# Patient Record
Sex: Male | Born: 1975 | Race: White | Hispanic: No | Marital: Single | Smoking: Current every day smoker
Health system: Southern US, Community
[De-identification: ages and names within clinical notes are randomized; demographics above are authoritative.]

## PROBLEM LIST (undated history)

## (undated) HISTORY — PX: APPENDECTOMY: SHX54

---

## 2004-05-13 ENCOUNTER — Emergency Department: Payer: Self-pay | Admitting: Emergency Medicine

## 2008-04-11 ENCOUNTER — Emergency Department (HOSPITAL_COMMUNITY): Admission: EM | Admit: 2008-04-11 | Discharge: 2008-04-11 | Payer: Self-pay | Admitting: Emergency Medicine

## 2011-04-07 ENCOUNTER — Emergency Department (HOSPITAL_COMMUNITY)
Admission: EM | Admit: 2011-04-07 | Discharge: 2011-04-07 | Disposition: A | Discharge status: Home / Self Care | Payer: Self-pay | Attending: Emergency Medicine | Admitting: Emergency Medicine

## 2011-04-07 ENCOUNTER — Encounter: Payer: Self-pay | Admitting: *Deleted

## 2011-04-07 DIAGNOSIS — F17200 Nicotine dependence, unspecified, uncomplicated: Secondary | ICD-10-CM | POA: Insufficient documentation

## 2011-04-07 DIAGNOSIS — Z9889 Other specified postprocedural states: Secondary | ICD-10-CM | POA: Insufficient documentation

## 2011-04-07 DIAGNOSIS — R21 Rash and other nonspecific skin eruption: Secondary | ICD-10-CM

## 2011-04-07 MED ORDER — PREDNISONE 20 MG PO TABS
40.0000 mg | ORAL_TABLET | Freq: Once | ORAL | Status: AC
  Administered 2011-04-07: 40 mg via ORAL
  Filled 2011-04-07: qty 2

## 2011-04-07 MED ORDER — PREDNISONE 20 MG PO TABS: 20.0000 mg | ORAL_TABLET | Freq: Every day | ORAL | Status: AC

## 2011-04-07 NOTE — ED Provider Notes (Signed)
History     CSN: [id number] Arrival date & time: 04/07/2011  4:03 AM   First MD Initiated Contact with Patient 04/07/11 0403      Chief Complaint  Patient presents with  . Rash    (Consider location/radiation/quality/duration/timing/severity/associated sxs/prior treatment) HPI Comments: Rash: Patient complains of rash involving the bilateral hand, torso and trunk. Rash started 3 days ago. Appearance of rash at onset: Color of lesion(s): pink, Texture of lesion(s): Flat and raised, Size of lesion(s): Several millimeters to several centimeters in size, Other appearance: Mild to moderate scaling. Rash has changed over time Initial distribution: bilateral arm.  Discomfort associated with rash: Is pruritic and now has a slightly burning feeling.  Associated symptoms: none. Denies: abdominal pain, arthralgia, congestion, cough, decrease in appetite, fever, headache, myalgia, nausea, sore throat and vomiting. Patient has not had previous evaluation of rash. Patient has not had previous treatment.  . Patient has not had contacts with similar rash. Patient has not identified precipitant. Patient has not had new exposures (soaps, lotions, laundry detergents, foods, medications, plants, insects or animals.)  He states that the rash started on his chest and upper back, has spread down his trunk and slightly onto his proximal thighs, minimal lesions on his arms and hands. He denies any vesicular lesions, pustular lesions or petechiae or purpura. He denies penile discharge, fever, stiff neck. Initially the rash was itchy but this has transitioned into more of a stinging feeling.  He has not tried any medications, denies oral lesions and has started no new medications. He takes no prescription medications and no over-the-counter medications   Patient is a 35 y.o. male presenting with rash. The history is provided by the patient.  Rash     History reviewed. No pertinent past medical history.  Past Surgical  History  Procedure Date  . Appendectomy     History reviewed. No pertinent family history.  History  Substance Use Topics  . Smoking status: Current Everyday Smoker -- 0.5 packs/day    Types: Cigarettes  . Smokeless tobacco: Not on file  . Alcohol Use: Yes      Review of Systems  Skin: Positive for rash.  All other systems reviewed and are negative.    Allergies  Review of patient's allergies indicates no known allergies.  Home Medications   Current Outpatient Rx  Name Route Sig Dispense Refill  . PREDNISONE 20 MG PO TABS Oral Take 1 tablet (20 mg total) by mouth daily. 10 tablet 0    BP 150/84  Pulse 77  Temp(Src) 97.7 F (36.5 C) (Oral)  Resp 16  Ht 6\' 6"  (1.981 m)  Wt 292 lb (132.45 kg)  BMI 33.74 kg/m2  SpO2 99%  Physical Exam  Nursing note and vitals reviewed. Constitutional: He appears well-developed and well-nourished. No distress.  HENT:  Head: Normocephalic and atraumatic.  Mouth/Throat: Oropharynx is clear and moist. No oropharyngeal exudate.       No oral lesions  Eyes: Conjunctivae and EOM are normal. Pupils are equal, round, and reactive to light. Right eye exhibits no discharge. Left eye exhibits no discharge. No scleral icterus.  Neck: Normal range of motion. Neck supple. No JVD present. No thyromegaly present.  Cardiovascular: Normal rate, regular rhythm, normal heart sounds and intact distal pulses.  Exam reveals no gallop and no friction rub.   No murmur heard. Pulmonary/Chest: Effort normal and breath sounds normal. No respiratory distress. He has no wheezes. He has no rales.  Abdominal: Soft. Bowel sounds are  normal. He exhibits no distension and no mass. There is no tenderness.  Musculoskeletal: Normal range of motion. He exhibits no edema and no tenderness.  Lymphadenopathy:    He has no cervical adenopathy.  Neurological: He is alert. Coordination normal.  Skin: Skin is warm and dry. Rash noted.       Scattered patches of pink  scaling skin covering the upper trauma, upper chest and upper back, proximal arms and minimal lesions on the hands. Scattered lesions across the thighs. No pustules, vesicles, petechiae or purpura  Psychiatric: He has a normal mood and affect. His behavior is normal.    ED Course  Procedures (including critical care time)  Labs Reviewed - No data to display No results found.   1. Rash       MDM  Consider pityriasis rosea versus a psoriatic outbreak possible sources. Does not appear to be an atopic dermatitis, prednisone therapy initiated though patient informed that may not be of any benefit to him. Dermatology followup contact information given and encouraged.        Vida Roller, MD 04/07/11 [phone]

## 2011-04-07 NOTE — ED Notes (Signed)
Pt given discharge instructions, paperwork & prescription(s), pt verbalized understanding.   

## 2011-04-07 NOTE — ED Notes (Signed)
Pt reports a rash on rt shoulder upper arm, left hand, that started 3 days ago & feels like it is spreading.

## 2011-04-07 NOTE — ED Notes (Signed)
Rash noted 3 days ago, pt states increase in rash & pain

## 2014-05-31 ENCOUNTER — Encounter (HOSPITAL_COMMUNITY): Payer: Self-pay | Admitting: *Deleted

## 2014-05-31 ENCOUNTER — Emergency Department (HOSPITAL_COMMUNITY)
Admission: EM | Admit: 2014-05-31 | Discharge: 2014-05-31 | Disposition: A | Discharge status: Home / Self Care | Payer: Self-pay | Attending: Emergency Medicine | Admitting: Emergency Medicine

## 2014-05-31 DIAGNOSIS — K029 Dental caries, unspecified: Secondary | ICD-10-CM | POA: Insufficient documentation

## 2014-05-31 DIAGNOSIS — K047 Periapical abscess without sinus: Secondary | ICD-10-CM | POA: Insufficient documentation

## 2014-05-31 DIAGNOSIS — Z720 Tobacco use: Secondary | ICD-10-CM | POA: Insufficient documentation

## 2014-05-31 DIAGNOSIS — Z792 Long term (current) use of antibiotics: Secondary | ICD-10-CM | POA: Insufficient documentation

## 2014-05-31 MED ORDER — AMOXICILLIN 250 MG PO CAPS
500.0000 mg | ORAL_CAPSULE | Freq: Once | ORAL | Status: AC
  Administered 2014-05-31: 500 mg via ORAL
  Filled 2014-05-31: qty 2

## 2014-05-31 MED ORDER — HYDROCODONE-ACETAMINOPHEN 5-325 MG PO TABS: 1.0000 | ORAL_TABLET | ORAL | Status: DC | PRN

## 2014-05-31 MED ORDER — HYDROCODONE-ACETAMINOPHEN 5-325 MG PO TABS
1.0000 | ORAL_TABLET | Freq: Once | ORAL | Status: AC
Start: 2014-05-31 — End: 2014-05-31
  Administered 2014-05-31: 1 via ORAL
  Filled 2014-05-31: qty 1

## 2014-05-31 MED ORDER — AMOXICILLIN 500 MG PO CAPS: 500.0000 mg | ORAL_CAPSULE | Freq: Three times a day (TID) | ORAL | Status: AC

## 2014-05-31 NOTE — ED Provider Notes (Signed)
CSN: [id number]     Arrival date & time 05/31/14  1455 History   First MD Initiated Contact with Patient 05/31/14 1611     Chief Complaint  Patient presents with  . Dental Pain     (Consider location/radiation/quality/duration/timing/severity/associated sxs/prior Treatment) Patient is a 39 y.o. male presenting with tooth pain. The history is provided by the patient.  Dental Pain Location:  Lower Lower teeth location:  18/LL 2nd molar Quality:  Sharp and throbbing Severity:  Severe Onset quality:  Gradual Timing:  Constant Progression:  Worsening Chronicity:  New Context: abscess, dental caries and dental fracture   Relieved by:  Nothing Worsened by:  Cold food/drink (has applied dental putty and obtained cold sensation relief) Ineffective treatments:  NSAIDs Associated symptoms: gum swelling   Associated symptoms: no difficulty swallowing, no drooling, no facial pain, no facial swelling, no fever, no neck pain, no neck swelling, no oral lesions and no trismus   Risk factors: no diabetes and no periodontal disease     History reviewed. No pertinent past medical history. Past Surgical History  Procedure Laterality Date  . Appendectomy     History reviewed. No pertinent family history. History  Substance Use Topics  . Smoking status: Current Every Day Smoker -- 0.50 packs/day    Types: Cigarettes  . Smokeless tobacco: Not on file  . Alcohol Use: Yes    Review of Systems  Constitutional: Negative for fever.  HENT: Positive for dental problem. Negative for drooling, facial swelling, mouth sores, rhinorrhea and sore throat.   Musculoskeletal: Negative for neck pain.      Allergies  Review of patient's allergies indicates no known allergies.  Home Medications   Prior to Admission medications   Medication Sig Start Date End Date Taking? Authorizing Provider  ibuprofen (ADVIL,MOTRIN) 200 MG tablet Take 800 mg by mouth every 6 (six) hours as needed for mild pain or  moderate pain.   Yes Historical Provider, MD  amoxicillin (AMOXIL) 500 MG capsule Take 1 capsule (500 mg total) by mouth 3 (three) times daily. 05/31/14 06/10/14  Burgess Amor, PA-C  HYDROcodone-acetaminophen (NORCO/VICODIN) 5-325 MG per tablet Take 1 tablet by mouth every 4 (four) hours as needed. 05/31/14   Burgess Amor, PA-C   BP 142/98 mmHg  Pulse 82  Temp(Src) 99.2 F (37.3 C) (Oral)  Resp 18  Ht  (2.007 m)  Wt 290 lb (131.543 kg)  BMI 32.66 kg/m2  SpO2 98% Physical Exam  Constitutional: He is oriented to person, place, and time. He appears well-developed and well-nourished. No distress.  HENT:  Head: Normocephalic and atraumatic.  Right Ear: Tympanic membrane and external ear normal.  Left Ear: Tympanic membrane and external ear normal.  Mouth/Throat: Oropharynx is clear and moist and mucous membranes are normal. No oral lesions. No trismus in the jaw. Abnormal dentition. Dental abscesses present.    Eyes: Conjunctivae are normal.  Neck: Normal range of motion. Neck supple.  Cardiovascular: Normal rate and normal heart sounds.   Pulmonary/Chest: Effort normal.  Abdominal: He exhibits no distension.  Musculoskeletal: Normal range of motion.  Lymphadenopathy:    He has no cervical adenopathy.  Neurological: He is alert and oriented to person, place, and time.  Skin: Skin is warm and dry. No erythema.  Psychiatric: He has a normal mood and affect.    ED Course  Procedures (including critical care time) Labs Review Labs Reviewed - No data to display  Imaging Review No results found.   EKG Interpretation None  MDM   Final diagnoses:  Dental abscess    Dental abscess without trismus, no drainable abscess.  No facial cellulitis.  Amoxil, hydrocodone.  Dental referrals given.    Burgess AmorJulie Denee Boeder, PA-C 05/31/14 1640  Benny LennertJoseph L Zammit, MD 06/22/14 [phone]

## 2014-05-31 NOTE — ED Notes (Signed)
Pt verbalized understanding of no driving and to use caution within 4 hours of taking pain meds due to meds cause drowsiness 

## 2014-05-31 NOTE — ED Notes (Signed)
Lt mandibular dental pain

## 2014-05-31 NOTE — Discharge Instructions (Signed)
Dental Abscess °A dental abscess is a collection of infected fluid (pus) from a bacterial infection in the inner part of the tooth (pulp). It usually occurs at the end of the tooth's root.  °CAUSES  °· Severe tooth decay. °· Trauma to the tooth that allows bacteria to enter into the pulp, such as a broken or chipped tooth. °SYMPTOMS  °· Severe pain in and around the infected tooth. °· Swelling and redness around the abscessed tooth or in the mouth or face. °· Tenderness. °· Pus drainage. °· Bad breath. °· Bitter taste in the mouth. °· Difficulty swallowing. °· Difficulty opening the mouth. °· Nausea. °· Vomiting. °· Chills. °· Swollen neck glands. °DIAGNOSIS  °· A medical and dental history will be taken. °· An examination will be performed by tapping on the abscessed tooth. °· X-rays may be taken of the tooth to identify the abscess. °TREATMENT °The goal of treatment is to eliminate the infection. You may be prescribed antibiotic medicine to stop the infection from spreading. A root canal may be performed to save the tooth. If the tooth cannot be saved, it may be pulled (extracted) and the abscess may be drained.  °HOME CARE INSTRUCTIONS °· Only take over-the-counter or prescription medicines for pain, fever, or discomfort as directed by your caregiver. °· Rinse your mouth (gargle) often with salt water (¼ tsp salt in 8 oz [250 ml] of warm water) to relieve pain or swelling. °· Do not drive after taking pain medicine (narcotics). °· Do not apply heat to the outside of your face. °· Return to your dentist for further treatment as directed. °SEEK MEDICAL CARE IF: °· Your pain is not helped by medicine. °· Your pain is getting worse instead of better. °SEEK IMMEDIATE MEDICAL CARE IF: °· You have a fever or persistent symptoms for more than 2-3 days. °· You have a fever and your symptoms suddenly get worse. °· You have chills or a very bad headache. °· You have problems breathing or swallowing. °· You have trouble  opening your mouth. °· You have swelling in the neck or around the eye. °Document Released: 04/23/2005 Document Revised: 01/16/2012 Document Reviewed: 08/01/2010 °[employer]® Patient Information ©2015 [employer]. This information is not intended to replace advice given to you by your health care provider. Make sure you discuss any questions you have with your health care provider. ° ° °Complete your entire course of antibiotics as prescribed.  You  may use the hydrocodone for pain relief but do not drive within 4 hours of taking as this will make you drowsy.  Avoid applying heat or ice to this abscess area which can worsen your symptoms.  You may use warm salt water swish and spit treatment or half peroxide and water swish and spit after meals to keep this area clean as discussed.  Call the dentist listed above for further management of your symptoms. ° ° °

## 2014-11-17 ENCOUNTER — Emergency Department (HOSPITAL_COMMUNITY)
Admission: EM | Admit: 2014-11-17 | Discharge: 2014-11-17 | Disposition: A | Discharge status: Home / Self Care | Payer: Self-pay | Attending: Emergency Medicine | Admitting: Emergency Medicine

## 2014-11-17 ENCOUNTER — Encounter (HOSPITAL_COMMUNITY): Payer: Self-pay | Admitting: Emergency Medicine

## 2014-11-17 ENCOUNTER — Emergency Department (HOSPITAL_COMMUNITY): Payer: Self-pay

## 2014-11-17 DIAGNOSIS — Z880 Allergy status to penicillin: Secondary | ICD-10-CM | POA: Insufficient documentation

## 2014-11-17 DIAGNOSIS — J0100 Acute maxillary sinusitis, unspecified: Secondary | ICD-10-CM | POA: Insufficient documentation

## 2014-11-17 DIAGNOSIS — Z720 Tobacco use: Secondary | ICD-10-CM | POA: Insufficient documentation

## 2014-11-17 DIAGNOSIS — G4700 Insomnia, unspecified: Secondary | ICD-10-CM | POA: Insufficient documentation

## 2014-11-17 MED ORDER — AZITHROMYCIN 250 MG PO TABS: 250.0000 mg | ORAL_TABLET | Freq: Every day | ORAL | Status: DC

## 2014-11-17 MED ORDER — PROMETHAZINE-CODEINE 6.25-10 MG/5ML PO SYRP: 5.0000 mL | ORAL_SOLUTION | ORAL | Status: DC | PRN

## 2014-11-17 MED ORDER — AZITHROMYCIN 250 MG PO TABS
500.0000 mg | ORAL_TABLET | Freq: Once | ORAL | Status: AC
  Administered 2014-11-17: 500 mg via ORAL
  Filled 2014-11-17: qty 2

## 2014-11-17 NOTE — Discharge Instructions (Signed)
Insomnia °Insomnia is frequent trouble falling and/or staying asleep. Insomnia can be a long term problem or a short term problem. Both are common. Insomnia can be a short term problem when the wakefulness is related to a certain stress or worry. Long term insomnia is often related to ongoing stress during waking hours and/or poor sleeping habits. Overtime, sleep deprivation itself can make the problem worse. Every little thing feels more severe because you are overtired and your ability to cope is decreased. °CAUSES  °· Stress, anxiety, and depression. °· Poor sleeping habits. °· Distractions such as TV in the bedroom. °· Naps close to bedtime. °· Engaging in emotionally charged conversations before bed. °· Technical reading before sleep. °· Alcohol and other sedatives. They may make the problem worse. They can hurt normal sleep patterns and normal dream activity. °· Stimulants such as caffeine for several hours prior to bedtime. °· Pain syndromes and shortness of breath can cause insomnia. °· Exercise late at night. °· Changing time zones may cause sleeping problems (jet lag). °It is sometimes helpful to have someone observe your sleeping patterns. They should look for periods of not breathing during the night (sleep apnea). They should also look to see how long those periods last. If you live alone or observers are uncertain, you can also be observed at a sleep clinic where your sleep patterns will be professionally monitored. Sleep apnea requires a checkup and treatment. Give your caregivers your medical history. Give your caregivers observations your family has made about your sleep.  °SYMPTOMS  °· Not feeling rested in the morning. °· Anxiety and restlessness at bedtime. °· Difficulty falling and staying asleep. °TREATMENT  °· Your caregiver may prescribe treatment for an underlying medical disorders. Your caregiver can give advice or help if you are using alcohol or other drugs for self-medication. Treatment  of underlying problems will usually eliminate insomnia problems. °· Medications can be prescribed for short time use. They are generally not recommended for lengthy use. °· Over-the-counter sleep medicines are not recommended for lengthy use. They can be habit forming. °· You can promote easier sleeping by making lifestyle changes such as: °· Using relaxation techniques that help with breathing and reduce muscle tension. °· Exercising earlier in the day. °· Changing your diet and the time of your last meal. No night time snacks. °· Establish a regular time to go to bed. °· Counseling can help with stressful problems and worry. °· Soothing music and white noise may be helpful if there are background noises you cannot remove. °· Stop tedious detailed work at least one hour before bedtime. °HOME CARE INSTRUCTIONS  °· Keep a diary. Inform your caregiver about your progress. This includes any medication side effects. See your caregiver regularly. Take note of: °· Times when you are asleep. °· Times when you are awake during the night. °· The quality of your sleep. °· How you feel the next day. °This information will help your caregiver care for you. °· Get out of bed if you are still awake after 15 minutes. Read or do some quiet activity. Keep the lights down. Wait until you feel sleepy and go back to bed. °· Keep regular sleeping and waking hours. Avoid naps. °· Exercise regularly. °· Avoid distractions at bedtime. Distractions include watching television or engaging in any intense or detailed activity like attempting to balance the household checkbook. °· Develop a bedtime ritual. Keep a familiar routine of bathing, brushing your teeth, climbing into bed at the same   time each night, listening to soothing music. Routines increase the success of falling to sleep faster.  Use relaxation techniques. This can be using breathing and muscle tension release routines. It can also include visualizing peaceful scenes. You can  also help control troubling or intruding thoughts by keeping your mind occupied with boring or repetitive thoughts like the old concept of counting sheep. You can make it more creative like imagining planting one beautiful flower after another in your backyard garden.  During your day, work to eliminate stress. When this is not possible use some of the previous suggestions to help reduce the anxiety that accompanies stressful situations. MAKE SURE YOU:   Understand these instructions.  Will watch your condition.  Will get help right away if you are not doing well or get worse. Document Released: 04/20/2000 Document Revised: 07/16/2011 Document Reviewed: 05/21/2007 [facility] Patient Information 2015 [place], [place]. This information is not intended to replace advice given to you by your health care provider. Make sure you discuss any questions you have with your health care provider.  Sinusitis Sinusitis is redness, soreness, and inflammation of the paranasal sinuses. Paranasal sinuses are air pockets within the bones of your face (beneath the eyes, the middle of the forehead, or above the eyes). In healthy paranasal sinuses, mucus is able to drain out, and air is able to circulate through them by way of your nose. However, when your paranasal sinuses are inflamed, mucus and air can become trapped. This can allow bacteria and other germs to grow and cause infection. Sinusitis can develop quickly and last only a short time (acute) or continue over a long period (chronic). Sinusitis that lasts for more than 12 weeks is considered chronic.  CAUSES  Causes of sinusitis include:  Allergies.  Structural abnormalities, such as displacement of the cartilage that separates your nostrils (deviated septum), which can decrease the air flow through your nose and sinuses and affect sinus drainage.  Functional abnormalities, such as when the small hairs (cilia) that line your sinuses and help remove mucus do  not work properly or are not present. SIGNS AND SYMPTOMS  Symptoms of acute and chronic sinusitis are the same. The primary symptoms are pain and pressure around the affected sinuses. Other symptoms include:  Upper toothache.  Earache.  Headache.  Bad breath.  Decreased sense of smell and taste.  A cough, which worsens when you are lying flat.  Fatigue.  Fever.  Thick drainage from your nose, which often is green and may contain pus (purulent).  Swelling and warmth over the affected sinuses. DIAGNOSIS  Your health care provider will perform a physical exam. During the exam, your health care provider may:  Look in your nose for signs of abnormal growths in your nostrils (nasal polyps).  Tap over the affected sinus to check for signs of infection.  View the inside of your sinuses (endoscopy) using an imaging device that has a light attached (endoscope). If your health care provider suspects that you have chronic sinusitis, one or more of the following tests may be recommended:  Allergy tests.  Nasal culture. A sample of mucus is taken from your nose, sent to a lab, and screened for bacteria.  Nasal cytology. A sample of mucus is taken from your nose and examined by your health care provider to determine if your sinusitis is related to an allergy. TREATMENT  Most cases of acute sinusitis are related to a viral infection and will resolve on their own within 10 days.  Sometimes medicines are prescribed to help relieve symptoms (pain medicine, decongestants, nasal steroid sprays, or saline sprays).  However, for sinusitis related to a bacterial infection, your health care provider will prescribe antibiotic medicines. These are medicines that will help kill the bacteria causing the infection.  Rarely, sinusitis is caused by a fungal infection. In theses cases, your health care provider will prescribe antifungal medicine. For some cases of chronic sinusitis, surgery is needed.  Generally, these are cases in which sinusitis recurs more than 3 times per year, despite other treatments. HOME CARE INSTRUCTIONS   Drink plenty of water. Water helps thin the mucus so your sinuses can drain more easily.  Use a humidifier.  Inhale steam 3 to 4 times a day (for example, sit in the bathroom with the shower running).  Apply a warm, moist washcloth to your face 3 to 4 times a day, or as directed by your health care provider.  Use saline nasal sprays to help moisten and clean your sinuses.  Take medicines only as directed by your health care provider.  If you were prescribed either an antibiotic or antifungal medicine, finish it all even if you start to feel better. SEEK IMMEDIATE MEDICAL CARE IF:  You have increasing pain or severe headaches.  You have nausea, vomiting, or drowsiness.  You have swelling around your face.  You have vision problems.  You have a stiff neck.  You have difficulty breathing. MAKE SURE YOU:   Understand these instructions.  Will watch your condition.  Will get help right away if you are not doing well or get worse. Document Released: 04/23/2005 Document Revised: 09/07/2013 Document Reviewed: 05/08/2011 [facility] Patient Information 2015 [place], [place]. This information is not intended to replace advice given to you by your health care provider. Make sure you discuss any questions you have with your health care provider.   Take your next dose of Zithromax tomorrow afternoon.  You may use the cough medication prescribed to help you with your symptoms, this will also make you drowsy.  Take a dose of this prior to bedtime to help sleep, however use caution during the daytime, do not drive within 4 hours of taking.  Which you're drinking plenty of fluids.  You may also try menthol cough drops or Vicks brand vapor inhaler sticks which can help with congestion.

## 2014-11-17 NOTE — ED Notes (Signed)
Pt reports nasal congestion,cough,body aches,chills x1 month. Pt denies any n/v/d.

## 2014-11-17 NOTE — ED Notes (Signed)
Julie Idol at bedside. 

## 2014-11-19 NOTE — ED Provider Notes (Signed)
CSN: [id number]     Arrival date & time 11/17/14  1142 History   First MD Initiated Contact with Patient 11/17/14 1411     Chief Complaint  Patient presents with  . Generalized Body Aches     (Consider location/radiation/quality/duration/timing/severity/associated sxs/prior Treatment) The history is provided by the patient.   Eddie Scott is a 39 y.o. male with no significant past medical history presenting with a one month history of nasal congestion with clear rhinorrhea, post nasal drip, non productive cough.  Over the past week he has developed generalized body aches with chills and suspected fever, facial pain and pressure suggestive of a sinus infection.  He has taken nyquil severe cold/flu formula and an otc sinus medicine without relief of symptoms.  He endorses difficulty sleeping at night the past 2 weeks possibly due to symptoms but finds himself just lying awake unable to sleep. He denies sob, chest pain, headache, dizziness.  He does feel pressure in his ears, no earache or drainage.     History reviewed. No pertinent past medical history. Past Surgical History  Procedure Laterality Date  . Appendectomy     History reviewed. No pertinent family history. History  Substance Use Topics  . Smoking status: Current Every Day Smoker -- 0.50 packs/day    Types: Cigarettes  . Smokeless tobacco: Not on file  . Alcohol Use: Yes     Comment: occasional    Review of Systems  Constitutional: Positive for fever and chills.  HENT: Positive for congestion, rhinorrhea and sinus pressure. Negative for ear discharge, ear pain, facial swelling, sore throat, trouble swallowing and voice change.   Eyes: Negative for discharge.  Respiratory: Positive for cough. Negative for shortness of breath, wheezing and stridor.   Cardiovascular: Negative for chest pain.  Gastrointestinal: Negative for abdominal pain.  Genitourinary: Negative.       Allergies  Penicillins  Home Medications    Prior to Admission medications   Medication Sig Start Date End Date Taking? Authorizing Provider  diphenhydramine-acetaminophen (TYLENOL PM) 25-500 MG TABS Take 2 tablets by mouth at bedtime as needed (sleep).   Yes Historical Provider, MD  OVER THE COUNTER MEDICATION Take 2 tablets by mouth daily as needed (sinus  **OTC sinus medication**).   Yes Historical Provider, MD  Phenyleph-Doxylamine-DM-APAP (NYQUIL SEVERE COLD/FLU) 5-6.25-10-325 MG/15ML LIQD Take 30 mLs by mouth at bedtime as needed (cold symptoms).   Yes Historical Provider, MD  azithromycin (ZITHROMAX) 250 MG tablet Take 1 tablet (250 mg total) by mouth daily. 11/17/14   Burgess Amor, PA-C  promethazine-codeine (PHENERGAN WITH CODEINE) 6.25-10 MG/5ML syrup Take 5 mLs by mouth every 4 (four) hours as needed for cough. 11/17/14   Burgess Amor, PA-C   BP 134/76 mmHg  Pulse 59  Temp(Src) 97.6 F (36.4 C) (Oral)  Resp 18  Ht  (2.007 m)  Wt 290 lb (131.543 kg)  BMI 32.66 kg/m2  SpO2 100% Physical Exam  Constitutional: He is oriented to person, place, and time. He appears well-developed and well-nourished.  HENT:  Head: Normocephalic and atraumatic.  Right Ear: Tympanic membrane and ear canal normal.  Left Ear: Tympanic membrane and ear canal normal.  Nose: Mucosal edema and rhinorrhea present. Right sinus exhibits maxillary sinus tenderness. Left sinus exhibits maxillary sinus tenderness.  Mouth/Throat: Uvula is midline, oropharynx is clear and moist and mucous membranes are normal. No oropharyngeal exudate, posterior oropharyngeal edema, posterior oropharyngeal erythema or tonsillar abscesses.  Eyes: Conjunctivae are normal.  Neck: Normal range of  motion. Neck supple.  Cardiovascular: Normal rate and normal heart sounds.   Pulmonary/Chest: Effort normal. No respiratory distress. He has no wheezes. He has no rhonchi. He has no rales.  Abdominal: Soft. There is no tenderness.  Musculoskeletal: Normal range of motion.   Neurological: He is alert and oriented to person, place, and time.  Skin: Skin is warm and dry. No rash noted.  Psychiatric: He has a normal mood and affect.    ED Course  Procedures (including critical care time) Labs Review Labs Reviewed - No data to display  Imaging Review Dg Chest 2 View  11/17/2014   CLINICAL DATA:  Chest congestion, sinus congestion for 1 month  EXAM: CHEST  2 VIEW  COMPARISON:  None.  FINDINGS: The heart size and mediastinal contours are within normal limits. Both lungs are clear. The visualized skeletal structures are unremarkable.  IMPRESSION: No active cardiopulmonary disease.   Electronically Signed   By: Natasha MeadLiviu  Pop M.D.   On: 11/17/2014 13:24     EKG Interpretation None      MDM   Final diagnoses:  Acute maxillary sinusitis, recurrence not specified  Insomnia   Patients labs and/or radiological studies were reviewed and considered during the medical decision making and disposition process.  Results were also discussed with patient. No pneumonia per xray.    Pt with acute sinusitis.  He also endorses difficulty sleeping, suspect possibly related to pseudoephedrine use.  He was placed on zithromax, phenergan with codeine prescribed for cough and to help sleep, advised to not take pseudoephdrine containing products in the evening.  May try menthol drops, vicks vapor stick, steam for congestion relief. Prn f/u if sx persist or worsen.  The patient appears reasonably screened and/or stabilized for discharge and I doubt any other medical condition or other [facility] requiring further screening, evaluation, or treatment in the ED at this time prior to discharge.     Burgess AmorJulie Angala Hilgers, PA-C 11/19/14 1231  Vanetta MuldersScott Zackowski, MD 11/20/14 [phone]

## 2016-08-31 ENCOUNTER — Encounter (HOSPITAL_COMMUNITY): Payer: Self-pay

## 2016-08-31 ENCOUNTER — Emergency Department (HOSPITAL_COMMUNITY)
Admission: EM | Admit: 2016-08-31 | Discharge: 2016-09-01 | Disposition: A | Discharge status: Home / Self Care | Payer: Self-pay | Attending: Emergency Medicine | Admitting: Emergency Medicine

## 2016-08-31 DIAGNOSIS — Z880 Allergy status to penicillin: Secondary | ICD-10-CM | POA: Insufficient documentation

## 2016-08-31 DIAGNOSIS — X58XXXA Exposure to other specified factors, initial encounter: Secondary | ICD-10-CM | POA: Insufficient documentation

## 2016-08-31 DIAGNOSIS — T18128A Food in esophagus causing other injury, initial encounter: Secondary | ICD-10-CM | POA: Insufficient documentation

## 2016-08-31 DIAGNOSIS — K449 Diaphragmatic hernia without obstruction or gangrene: Secondary | ICD-10-CM | POA: Insufficient documentation

## 2016-08-31 DIAGNOSIS — F17210 Nicotine dependence, cigarettes, uncomplicated: Secondary | ICD-10-CM | POA: Insufficient documentation

## 2016-08-31 MED ORDER — ONDANSETRON HCL 4 MG/2ML IJ SOLN
4.0000 mg | Freq: Once | INTRAMUSCULAR | Status: AC
  Administered 2016-08-31: 4 mg via INTRAVENOUS
  Filled 2016-08-31: qty 2

## 2016-08-31 MED ORDER — GLUCAGON HCL RDNA (DIAGNOSTIC) 1 MG IJ SOLR
1.0000 mg | Freq: Once | INTRAMUSCULAR | Status: AC
  Administered 2016-08-31: 1 mg via INTRAVENOUS
  Filled 2016-08-31: qty 1

## 2016-08-31 MED ORDER — DIAZEPAM 5 MG/ML IJ SOLN
5.0000 mg | Freq: Once | INTRAMUSCULAR | Status: AC
  Administered 2016-08-31: 5 mg via INTRAVENOUS
  Filled 2016-08-31: qty 2

## 2016-08-31 MED ORDER — SODIUM CHLORIDE 0.9 % IV SOLN
Freq: Once | INTRAVENOUS | Status: AC
  Administered 2016-08-31: via INTRAVENOUS

## 2016-08-31 NOTE — ED Triage Notes (Signed)
Pt states he was eating steak an hour ago when he felt a piece get stuck and has not been able to swallow it down.  Pt reports history of same in the past but was able to get it down eventually.

## 2016-08-31 NOTE — ED Provider Notes (Signed)
[facility]-EM[facility]Y DEPT Provider Note   CSN: [id number] Arrival date & time: 08/31/16  2307  By signing my name below, I, Eddie Scott, attest that this documentation has been prepared under the direction and in the presence of [name], MD. Electronically Signed: Elder Scott, Scribe. 08/31/16. 11:27 PM.   History   Chief Complaint Chief Complaint  Patient presents with  . Food stuck in esophagus    HPI Eddie Scott is a 41 y.o. male without any chronic medical problems who presents to the ED with sensation or foreign body in throat. This patient states that approximately 1 hour ago he was eating boneless steak when he felt a large bolus become stuck in his throat. He has been unable to tolerate his own saliva since that time. He denies any difficulty respirating or speaking. He has had a similar problem happen in the past, but at the time was able to pass the bolus on his own without dilation. Denies any chest pain or cardiac history.   The history is provided by the patient. No language interpreter was used.    History reviewed. No pertinent past medical history.  There are no active problems to display for this patient.   Past Surgical History:  Procedure Laterality Date  . APPENDECTOMY         Home Medications    Prior to Admission medications   Medication Sig Start Date End Date Taking? Authorizing Provider  azithromycin (ZITHROMAX) 250 MG tablet Take 1 tablet (250 mg total) by mouth daily. 11/17/14   Burgess Amor, PA-C  diphenhydramine-acetaminophen (TYLENOL PM) 25-500 MG TABS Take 2 tablets by mouth at bedtime as needed (sleep).    Historical Provider, MD  OVER THE COUNTER MEDICATION Take 2 tablets by mouth daily as needed (sinus  **OTC sinus medication**).    Historical Provider, MD  Phenyleph-Doxylamine-DM-APAP (NYQUIL SEVERE COLD/FLU) 5-6.25-10-325 MG/15ML LIQD Take 30 mLs by mouth at bedtime as needed (cold symptoms).    Historical Provider, MD    promethazine-codeine (PHENERGAN WITH CODEINE) 6.25-10 MG/5ML syrup Take 5 mLs by mouth every 4 (four) hours as needed for cough. 11/17/14   Burgess Amor, PA-C    Family History No family history on file.  Social History Social History  Substance Use Topics  . Smoking status: Current Every Day Smoker    Packs/day: 0.50    Types: Cigarettes  . Smokeless tobacco: Never Used  . Alcohol use Yes     Comment: occasional     Allergies   Penicillins   Review of Systems Review of Systems  HENT: Negative for trouble swallowing.        No difficulty swallowing. Sensation of foreign body in throat.   Cardiovascular: Negative for chest pain.  All other systems reviewed and are negative.    Physical Exam Updated Vital Signs BP 118/83 (BP Location: Left Arm)   Pulse 71   Resp (!) 22   Ht  (2.007 m)   Wt 300 lb (136.1 kg)   SpO2 100%   BMI 33.80 kg/m   Physical Exam  Constitutional: He is oriented to person, place, and time. He appears well-developed and well-nourished.  Appears uncomfortable. Spitting in emesis bag.   HENT:  Head: Normocephalic and atraumatic.  Mouth/Throat: Oropharynx is clear and moist. No oropharyngeal exudate.  No foreign bodies visualized in the oropharynx.   Eyes: Conjunctivae and EOM are normal. Pupils are equal, round, and reactive to light.  Neck: Normal range of motion. Neck supple.  No meningismus.  Cardiovascular: Regular rhythm, normal heart sounds and intact distal pulses.  Tachycardia present.   No murmur heard. Pulmonary/Chest: Effort normal and breath sounds normal. No respiratory distress.  Abdominal: Soft. There is no tenderness. There is no rebound and no guarding.  Musculoskeletal: Normal range of motion. He exhibits no edema or tenderness.  Neurological: He is alert and oriented to person, place, and time. No cranial nerve deficit. He exhibits normal muscle tone. Coordination normal.   5/5 strength throughout. CN 2-12 intact.Equal  grip strength.   Skin: Skin is warm. He is diaphoretic.  Psychiatric: He has a normal mood and affect. His behavior is normal.  Nursing note and vitals reviewed.    ED Treatments / Results  Labs (all labs ordered are listed, but only abnormal results are displayed) Labs Reviewed  CBC WITH DIFFERENTIAL/PLATELET - Abnormal; Notable for the following:       Result Value   WBC 23.8 (*)    Hemoglobin 17.2 (*)    Neutro Abs 20.6 (*)    Monocytes Absolute 1.4 (*)    All other components within normal limits  BASIC METABOLIC PANEL - Abnormal; Notable for the following:    CO2 20 (*)    Glucose, Bld 101 (*)    All other components within normal limits  TROPONIN I    EKG  EKG Interpretation  Date/Time:  Friday August 31 2016 23:38:34 EDT Ventricular Rate:  86 PR Interval:    QRS Duration: 114 QT Interval:  392 QTC Calculation: 469 R Axis:   77 Text Interpretation:  Sinus rhythm Borderline intraventricular conduction delay No previous ECGs available Confirmed by Manus Gunning  MD, Abdulaziz Toman ([id number]) on 09/01/2016 12:01:34 AM       Radiology No results found.  Procedures Procedures (including critical care time)  Medications Ordered in ED Medications  0.9 %  sodium chloride infusion (not administered)  ondansetron (ZOFRAN) injection 4 mg (4 mg Intravenous Given 08/31/16 2330)  diazepam (VALIUM) injection 5 mg (5 mg Intravenous Given 08/31/16 2330)  glucagon (human recombinant) (GLUCAGEN) injection 1 mg (1 mg Intravenous Given 08/31/16 2330)     Initial Impression / Assessment and Plan / ED Course  I have reviewed the triage vital signs and the nursing notes.  Pertinent labs & imaging results that were available during my care of the patient were reviewed by me and considered in my medical decision making (see chart for details).    Patient presents with likely food impaction. He is anxious, diaphoretic, tearful, spitting and emesis bag.  Patient given IV Valium and IV glucagon  with antiemetics.  Patient feels like the bolus has moved slightly but is still spitting in emesis bag. Unable tolerate by mouth. Discussed with Dr. Jena Gauss who will plan for endoscopy.  Patient taken to endoscopy with Dr. Jena Gauss.  Airway remains patent.   Final Clinical Impressions(s) / ED Diagnoses   Final diagnoses:  Food impaction of esophagus, initial encounter    New Prescriptions New Prescriptions   No medications on file  I personally performed the services described in this documentation, which was scribed in my presence. The recorded information has been reviewed and is accurate.    [name], MD 09/01/16 [phone]

## 2016-09-01 ENCOUNTER — Encounter (HOSPITAL_COMMUNITY): Payer: Self-pay | Admitting: Anesthesiology

## 2016-09-01 ENCOUNTER — Encounter (HOSPITAL_COMMUNITY)
Admission: EM | Disposition: A | Discharge status: Home / Self Care | Payer: Self-pay | Source: Home / Self Care | Attending: Emergency Medicine

## 2016-09-01 ENCOUNTER — Encounter (HOSPITAL_COMMUNITY): Payer: Self-pay

## 2016-09-01 DIAGNOSIS — R1310 Dysphagia, unspecified: Secondary | ICD-10-CM

## 2016-09-01 HISTORY — PX: ESOPHAGOGASTRODUODENOSCOPY: SHX1529

## 2016-09-01 HISTORY — PX: ESOPHAGOGASTRODUODENOSCOPY: SHX5428

## 2016-09-01 LAB — BASIC METABOLIC PANEL
ANION GAP: 14 (ref 5–15)
BUN: 17 mg/dL (ref 6–20)
CHLORIDE: 107 mmol/L (ref 101–111)
CO2: 20 mmol/L — AB (ref 22–32)
CREATININE: 1.18 mg/dL (ref 0.61–1.24)
Calcium: 9.9 mg/dL (ref 8.9–10.3)
GFR calc Af Amer: >60 mL/min (ref >60)
GFR calc non Af Amer: >60 mL/min (ref >60)
GLUCOSE: 101 mg/dL — AB (ref 65–99)
Potassium: 3.6 mmol/L (ref 3.5–5.1)
Sodium: 141 mmol/L (ref 135–145)

## 2016-09-01 LAB — CBC WITH DIFFERENTIAL/PLATELET
Basophils Absolute: 0.1 10*3/uL (ref 0.0–0.1)
Basophils Relative: 0 %
Eosinophils Absolute: 0.1 10*3/uL (ref 0.0–0.7)
Eosinophils Relative: 1 %
HEMATOCRIT: 50.8 % (ref 39.0–52.0)
HEMOGLOBIN: 17.2 g/dL — AB (ref 13.0–17.0)
LYMPHS ABS: 1.7 10*3/uL (ref 0.7–4.0)
LYMPHS PCT: 7 %
MCH: 30.2 pg (ref 26.0–34.0)
MCHC: 33.9 g/dL (ref 30.0–36.0)
MCV: 89.1 fL (ref 78.0–100.0)
MONO ABS: 1.4 10*3/uL — AB (ref 0.1–1.0)
MONOS PCT: 6 %
NEUTROS PCT: 86 %
Neutro Abs: 20.6 10*3/uL — ABNORMAL HIGH (ref 1.7–7.7)
Platelets: 261 10*3/uL (ref 150–400)
RBC: 5.7 MIL/uL (ref 4.22–5.81)
RDW: 13.5 % (ref 11.5–15.5)
WBC: 23.8 10*3/uL — ABNORMAL HIGH (ref 4.0–10.5)

## 2016-09-01 LAB — TROPONIN I: Troponin I: <0.03 ng/mL (ref <0.03)

## 2016-09-01 SURGERY — EGD (ESOPHAGOGASTRODUODENOSCOPY)
Anesthesia: Moderate Sedation

## 2016-09-01 MED ORDER — LIDOCAINE VISCOUS 2 % MT SOLN
OROMUCOSAL | Status: DC
Start: 2016-09-01 — End: 2016-09-01
  Filled 2016-09-01: qty 15

## 2016-09-01 MED ORDER — ONDANSETRON HCL 4 MG/2ML IJ SOLN
INTRAMUSCULAR | Status: AC
  Filled 2016-09-01: qty 2

## 2016-09-01 MED ORDER — BUTAMBEN-TETRACAINE-BENZOCAINE 2-2-14 % EX AERO
INHALATION_SPRAY | CUTANEOUS | Status: AC
  Filled 2016-09-01: qty 20

## 2016-09-01 MED ORDER — ONDANSETRON HCL 4 MG/2ML IJ SOLN
INTRAMUSCULAR | Status: DC | PRN
  Administered 2016-09-01: 4 mg via INTRAVENOUS

## 2016-09-01 MED ORDER — MEPERIDINE HCL 100 MG/ML IJ SOLN
INTRAMUSCULAR | Status: DC | PRN
Start: 2016-09-01 — End: 2016-09-01
  Administered 2016-09-01: 50 mg via INTRAVENOUS
  Administered 2016-09-01: 25 mg via INTRAVENOUS
  Administered 2016-09-01: 50 mg via INTRAVENOUS

## 2016-09-01 MED ORDER — LIDOCAINE VISCOUS 2 % MT SOLN
OROMUCOSAL | Status: DC | PRN
Start: 2016-09-01 — End: 2016-09-01
  Administered 2016-09-01: 1 via OROMUCOSAL

## 2016-09-01 MED ORDER — MEPERIDINE HCL 100 MG/ML IJ SOLN
INTRAMUSCULAR | Status: AC
  Filled 2016-09-01: qty 2

## 2016-09-01 MED ORDER — MIDAZOLAM HCL 5 MG/5ML IJ SOLN
INTRAMUSCULAR | Status: AC
  Filled 2016-09-01: qty 10

## 2016-09-01 MED ORDER — DIAZEPAM 5 MG/ML IJ SOLN
INTRAMUSCULAR | Status: AC
  Filled 2016-09-01: qty 2

## 2016-09-01 MED ORDER — MIDAZOLAM HCL 5 MG/5ML IJ SOLN
INTRAMUSCULAR | Status: DC | PRN
  Administered 2016-09-01 (×2): 2 mg via INTRAVENOUS
  Administered 2016-09-01: 1 mg via INTRAVENOUS

## 2016-09-01 MED ORDER — DIAZEPAM 5 MG/ML IJ SOLN
2.5000 mg | Freq: Once | INTRAMUSCULAR | Status: AC
Start: 2016-09-01 — End: 2016-09-01
  Administered 2016-09-01: 2.5 mg via INTRAVENOUS

## 2016-09-01 NOTE — ED Notes (Signed)
Pt walking down hallway, pt stopped by this nurse and asked where he was going. Pt says he is walking home, that he has to check on his son. This nurse advised pt that they were working on a ride for pt. Pt again stated he was walking home & we could not stop him. Pt continued to walk out saying they will just have to arrest him. Pt left w/o discharge instructions or paperwork.

## 2016-09-01 NOTE — Progress Notes (Signed)
Report called and given to Meridee Score, Rn.  Instructed on patients situation regarding oral fluids, medications, and discharge instructions and no ride home.  Verbalized understanding.  Patient discharged in stable condition to emergency room.

## 2016-09-01 NOTE — Op Note (Signed)
[facility] Patient Name: Eddie Scott Procedure Date: 09/01/2016 1:04 AM MRN: [id number] Date of Birth: May 27, 1975 Attending MD: Gennette Pac , MD CSN: [id number] Age: 41 Admit Type: Inpatient Procedure:                Upper GI endoscopy Indications:              Dysphagia/ food impaction Providers:                Gennette Pac, MD, Toniann Fail RN, RN,                            Burke Keels, Technician Referring MD:             EDP Rancour Medicines:                Midazolam 5 mg IV, Meperidine 125 mg IV,                            Ondansetron 4 mg IV Complications:            No immediate complications. Estimated Blood Loss:     Estimated blood loss was minimal. Procedure:                Pre-Anesthesia Assessment:                           - Prior to the procedure, a History and Physical                            was performed, and patient medications and                            allergies were reviewed. The patient's tolerance of                            previous anesthesia was also reviewed. The risks                            and benefits of the procedure and the sedation                            options and risks were discussed with the patient.                            All questions were answered, and informed consent                            was obtained. Prior Anticoagulants: The patient has                            taken no previous anticoagulant or antiplatelet                            agents. ASA Grade Assessment: II - A patient with  mild systemic disease. After reviewing the risks                            and benefits, the patient was deemed in                            satisfactory condition to undergo the procedure.                           After obtaining informed consent, the endoscope was                            passed under direct vision. Throughout the                             procedure, the patient's blood pressure, pulse, and                            oxygen saturations were monitored continuously. The                            [id number] ([id number]) scope was introduced through the                            mouth, and advanced to the fundus of the stomach.                            The upper GI endoscopy was accomplished without                            difficulty. The patient tolerated the procedure                            well. The patient tolerated the procedure well. Scope In: 1:25:55 AM Scope Out: 1:43:10 AM Total Procedure Duration: 0 hours 17 minutes 15 seconds  Findings:      Medium-sized hiatal hernia was present. Food bolus in midesophagus -       removed in piecemeal fashion with cherry picker and Roth net. Final       small piece fof meat fell into hiatal hernia sac. Question ringed       appearance the esophagus suspicious for EOE. Complete exam not carried       out. No dilation performed. Impression:               - Medium-sized hiatal hernia. Food impaction.                            Status post removal                           - No specimens collected. Moderate Sedation:      Moderate (conscious) sedation was administered by the endoscopy nurse       and supervised by the endoscopist. The following parameters were       monitored: oxygen saturation, heart rate, blood pressure, respiratory  rate, EKG, adequacy of pulmonary ventilation, and response to care.       Total physician intraservice time was 25 minutes. Recommendation:           - Patient has a contact number available for                            emergencies. The signs and symptoms of potential                            delayed complications were discussed with the                            patient. Return to normal activities tomorrow.                            Written discharge instructions were provided to the                            patient.                            - Chopped diet.                           - Continue present medications.                           - Repeat upper endoscopy in 1 month because the                            preparation was poor. Begin Protonix 40 mg daily.                           - Return to GI clinic in 4 weeks. Procedure Code(s):        --- Professional ---                           43200, Esophagoscopy, flexible, transoral;                            diagnostic, including collection of specimen(s) by                            brushing or washing, when performed (separate                            procedure)                           99152, Moderate sedation services provided by the                            same physician or other qualified health care                            professional performing  the diagnostic or                            therapeutic service that the sedation supports,                            requiring the presence of an independent trained                            observer to assist in the monitoring of the                            patient's level of consciousness and physiological                            status; initial 15 minutes of intraservice time,                            patient age 74 years or older                           [phone], Moderate sedation services; each additional                            15 minutes intraservice time Diagnosis Code(s):        --- Professional ---                           K44.9, Diaphragmatic hernia without obstruction or                            gangrene                           R13.10, Dysphagia, unspecified CPT copyright 2016 [employer]. All rights reserved. The codes documented in this report are preliminary and upon coder review may  be revised to meet current compliance requirements. Eddie Scott. Eddie Bogdon, MD Gennette Pac, MD 09/01/2016 1:52:56 AM This report has been signed  electronically. Number of Addenda: 0

## 2016-09-01 NOTE — Discharge Instructions (Signed)
EGD Discharge instructions Please read the instructions outlined below and refer to this sheet in the next few weeks. These discharge instructions provide you with general information on caring for yourself after you leave the hospital. Your doctor may also give you specific instructions. While your treatment has been planned according to the most current medical practices available, unavoidable complications occasionally occur. If you have any problems or questions after discharge, please call your doctor. ACTIVITY  You may resume your regular activity but move at a slower pace for the next 24 hours.   Take frequent rest periods for the next 24 hours.   Walking will help expel (get rid of) the air and reduce the bloated feeling in your abdomen.   No driving for 24 hours (because of the anesthesia (medicine) used during the test).   You may shower.   Do not sign any important legal documents or operate any machinery for 24 hours (because of the anesthesia used during the test).  NUTRITION  Drink plenty of fluids.   You may resume your normal diet.   Begin with a light meal and progress to your normal diet.   Avoid alcoholic beverages for 24 hours or as instructed by your caregiver.  MEDICATIONS  You may resume your normal medications unless your caregiver tells you otherwise.  WHAT YOU CAN EXPECT TODAY  You may experience abdominal discomfort such as a feeling of fullness or gas pains.  FOLLOW-UP  Your doctor will discuss the results of your test with you.  SEEK IMMEDIATE MEDICAL ATTENTION IF ANY OF THE FOLLOWING OCCUR:  Excessive nausea (feeling sick to your stomach) and/or vomiting.   Severe abdominal pain and distention (swelling).   Trouble swallowing.   Temperature over 101 F (37.8 C).   Rectal bleeding or vomiting of blood.    Chopped meats. Begin Protonix 40 mg daily  My office will call you the first of the week to arrange a follow-up appointment to set up  a repeat EGD with esophageal dilation, etc.  Esophagogastroduodenoscopy Esophagogastroduodenoscopy (EGD) is a procedure to examine the lining of the esophagus, stomach, and first part of the small intestine (duodenum). This procedure is done to check for problems such as inflammation, bleeding, ulcers, or growths. During this procedure, a long, flexible, lighted tube with a camera attached (endoscope) is inserted down the throat. Tell a health care provider about:  Any allergies you have.  All medicines you are taking, including vitamins, herbs, eye drops, creams, and over-the-counter medicines.  Any problems you or family members have had with anesthetic medicines.  Any blood disorders you have.  Any surgeries you have had.  Any medical conditions you have.  Whether you are pregnant or may be pregnant. What are the risks? Generally, this is a safe procedure. However, problems may occur, including:  Infection.  Bleeding.  A tear (perforation) in the esophagus, stomach, or duodenum.  Trouble breathing.  Excessive sweating.  Spasms of the larynx.  A slowed heartbeat.  Low blood pressure. What happens before the procedure?  Follow instructions from your health care provider about eating or drinking restrictions.  Ask your health care provider about:  Changing or stopping your regular medicines. This is especially important if you are taking diabetes medicines or blood thinners.  Taking medicines such as aspirin and ibuprofen. These medicines can thin your blood. Do not take these medicines before your procedure if your health care provider instructs you not to.  Plan to have someone take you home  after the procedure.  If you wear dentures, be ready to remove them before the procedure. What happens during the procedure?  To reduce your risk of infection, your health care team will wash or sanitize their hands.  An IV tube will be put in a vein in your hand or arm.  You will get medicines and fluids through this tube.  You will be given one or more of the following:  A medicine to help you relax (sedative).  A medicine to numb the area (local anesthetic). This medicine may be sprayed into your throat. It will make you feel more comfortable and keep you from gagging or coughing during the procedure.  A medicine for pain.  A mouth guard may be placed in your mouth to protect your teeth and to keep you from biting on the endoscope.  You will be asked to lie on your left side.  The endoscope will be lowered down your throat into your esophagus, stomach, and duodenum.  Air will be put into the endoscope. This will help your health care provider see better.  The lining of your esophagus, stomach, and duodenum will be examined.  Your health care provider may:  Take a tissue sample so it can be looked at in a lab (biopsy).  Remove growths.  Remove objects (foreign bodies) that are stuck.  Treat any bleeding with medicines or other devices that stop tissue from bleeding.  Widen (dilate) or stretch narrowed areas of your esophagus and stomach.  The endoscope will be taken out. The procedure may vary among health care providers and hospitals. What happens after the procedure?  Your blood pressure, heart rate, breathing rate, and blood oxygen level will be monitored often until the medicines you were given have worn off.  Do not eat or drink anything until the numbing medicine has worn off and your gag reflex has returned. This information is not intended to replace advice given to you by your health care provider. Make sure you discuss any questions you have with your health care provider. Document Released: 08/24/2004 Document Revised: 09/29/2015 Document Reviewed: 03/17/2015 Elsevier Interactive Patient Education  2017 [employer].

## 2016-09-01 NOTE — Progress Notes (Signed)
Reviewed discharge instructions with the charge nurse in the emergency room and again with the patient.  Protonix prescription and instructions given to the nurse.

## 2016-09-01 NOTE — ED Notes (Signed)
Pt transported to endo for procedure.

## 2016-09-01 NOTE — H&P (Addendum)
 @   Primary Care Physician:  No PCP Per Patient Primary Gastroenterologist:  Dr. Jena Gauss  Pre-Procedure History & Physical: HPI:  Eddie Scott is a 41 y.o. male here for probable food impaction.  Presented to ED this evening. Saw Dr. Consuella Lose. Received glucagon and Valium. Without any improvement. Swallowed a piece of steak at 2130 yesterday. Unable to swallow even saliva since. Has had chronic intermittent symptoms. No prior GI evaluation. Denies reflux symptoms.  History reviewed. No pertinent past medical history.  Past Surgical History:  Procedure Laterality Date  . APPENDECTOMY      Prior to Admission medications   Medication Sig Start Date End Date Taking? Authorizing Provider  azithromycin (ZITHROMAX) 250 MG tablet Take 1 tablet (250 mg total) by mouth daily. 11/17/14   Burgess Amor, PA-C  diphenhydramine-acetaminophen (TYLENOL PM) 25-500 MG TABS Take 2 tablets by mouth at bedtime as needed (sleep).    Historical Provider, MD  OVER THE COUNTER MEDICATION Take 2 tablets by mouth daily as needed (sinus  **OTC sinus medication**).    Historical Provider, MD  Phenyleph-Doxylamine-DM-APAP (NYQUIL SEVERE COLD/FLU) 5-6.25-10-325 MG/15ML LIQD Take 30 mLs by mouth at bedtime as needed (cold symptoms).    Historical Provider, MD  promethazine-codeine (PHENERGAN WITH CODEINE) 6.25-10 MG/5ML syrup Take 5 mLs by mouth every 4 (four) hours as needed for cough. 11/17/14   Burgess Amor, PA-C    Allergies as of 08/31/2016 - Review Complete 08/31/2016  Allergen Reaction Noted  . Penicillins  11/17/2014    No family history on file.  Social History   Social History  . Marital status: Single    Spouse name: N/A  . Number of children: N/A  . Years of education: N/A   Occupational History  . Not on file.   Social History Main Topics  . Smoking status: Current Every Day Smoker    Packs/day: 0.50    Types: Cigarettes  . Smokeless tobacco: Never Used  . Alcohol use Yes     Comment:  occasional  . Drug use: Yes    Types: Marijuana     Comment: occasional  . Sexual activity: Not on file   Other Topics Concern  . Not on file   Social History Narrative  . No narrative on file    Review of Systems: See HPI, otherwise negative ROS  Physical Exam: BP 130/67   Pulse 86   Resp 19   Ht  (2.007 m)   Wt 300 lb (136.1 kg)   SpO2 96%   BMI 33.80 kg/m  General:   Alert,  Well-developed, anxious appearing and cooperative in NAD. Unable to swallow saliva. Holding emesis patient Mouth:  No deformity or lesions. Neck:  Supple; no masses or thyromegaly. No significant cervical adenopathy. Lungs:  Clear throughout to auscultation.   No wheezes, crackles, or rhonchi. No acute distress. Heart:  Regular rate and rhythm; no murmurs, clicks, rubs,  or gallops. Abdomen: Non-distended, normal bowel sounds.  Soft and nontender without appreciable mass or hepatosplenomegaly.  Pulses:  Normal pulses noted. Extremities:  Without clubbing or edema.  Impression: 41 year old gentleman with chronic intermittent esophageal dysphagia. Presents with the signs and symptoms consistent with food impaction. Patient likely has ring, web, stricture orEOE.   Recommendations: I will offer the patient emergent EGD with food disimpaction.  Patient understands if upper GI tract is not empty, will not attempt dilation today. He will need to return for elective survey of his upper GI tract, dilation as feasible/appropriate at  a later date.      Notice: This dictation was prepared with Dragon dictation along with smaller phrase technology. Any transcriptional errors that result from this process are unintentional and may not be corrected upon review.

## 2016-09-01 NOTE — Progress Notes (Signed)
0100 Patient stated he does not have a ride home.  Patient to return to the emergency room for after care per Dr. Jena Gauss.  Patient verbalized understanding.

## 2016-09-01 NOTE — ED Notes (Signed)
Pt insisting on going home, says his 40 year old son it at home by himself and he needs to get home. Pt says he was dropped off here at ED and is not driving home, but walking.  Talked with patient and attempting to calm him down, informed pt that we would have [employer] take him home, pt reports living on [address] in [place], Pt agreed to wait on officer for ride home.  Pt reports he does not have car here, pt says he got dropped off by co-worker at [employer] where he got choked on steak.  Pt had took IV out himself prior to this nurse entering room. Bandage applied, bleeding controlled. Pt reports he is able to feel roof of mouth. Pt given cup of water and able to drink with no difficulty.

## 2016-09-03 ENCOUNTER — Encounter: Payer: Self-pay | Admitting: Internal Medicine

## 2016-09-10 ENCOUNTER — Ambulatory Visit: Payer: Self-pay | Admitting: Gastroenterology

## 2016-09-10 ENCOUNTER — Telehealth: Payer: Self-pay | Admitting: Gastroenterology

## 2016-09-10 ENCOUNTER — Encounter: Payer: Self-pay | Admitting: Gastroenterology

## 2016-09-10 NOTE — Progress Notes (Deleted)
Medium-sized hiatal hernia was present. Food bolus in midesophagus - removed in piecemeal fashion with cherry picker and Roth net. Final small piece fof meat fell into hiatal hernia sac. Question ringed appearance the esophagus suspicious for EOE. Complete exam not carried out. No dilation performed. Findings: - Medium-sized hiatal hernia. Food impaction. Status post removal - No specimens collected.

## 2016-09-10 NOTE — Telephone Encounter (Signed)
PT WAS A NO SHOW AND LETTER SENT  °

## 2016-09-21 ENCOUNTER — Encounter (HOSPITAL_COMMUNITY): Payer: Self-pay | Admitting: Internal Medicine

## 2016-12-31 IMAGING — DX DG CHEST 2V
2 series · 2 of 2 positions shown · non-contrast
Comparison: None.

CLINICAL DATA: Chest congestion, sinus congestion for 1 month

EXAM:
CHEST  2 VIEW

[chest pa]
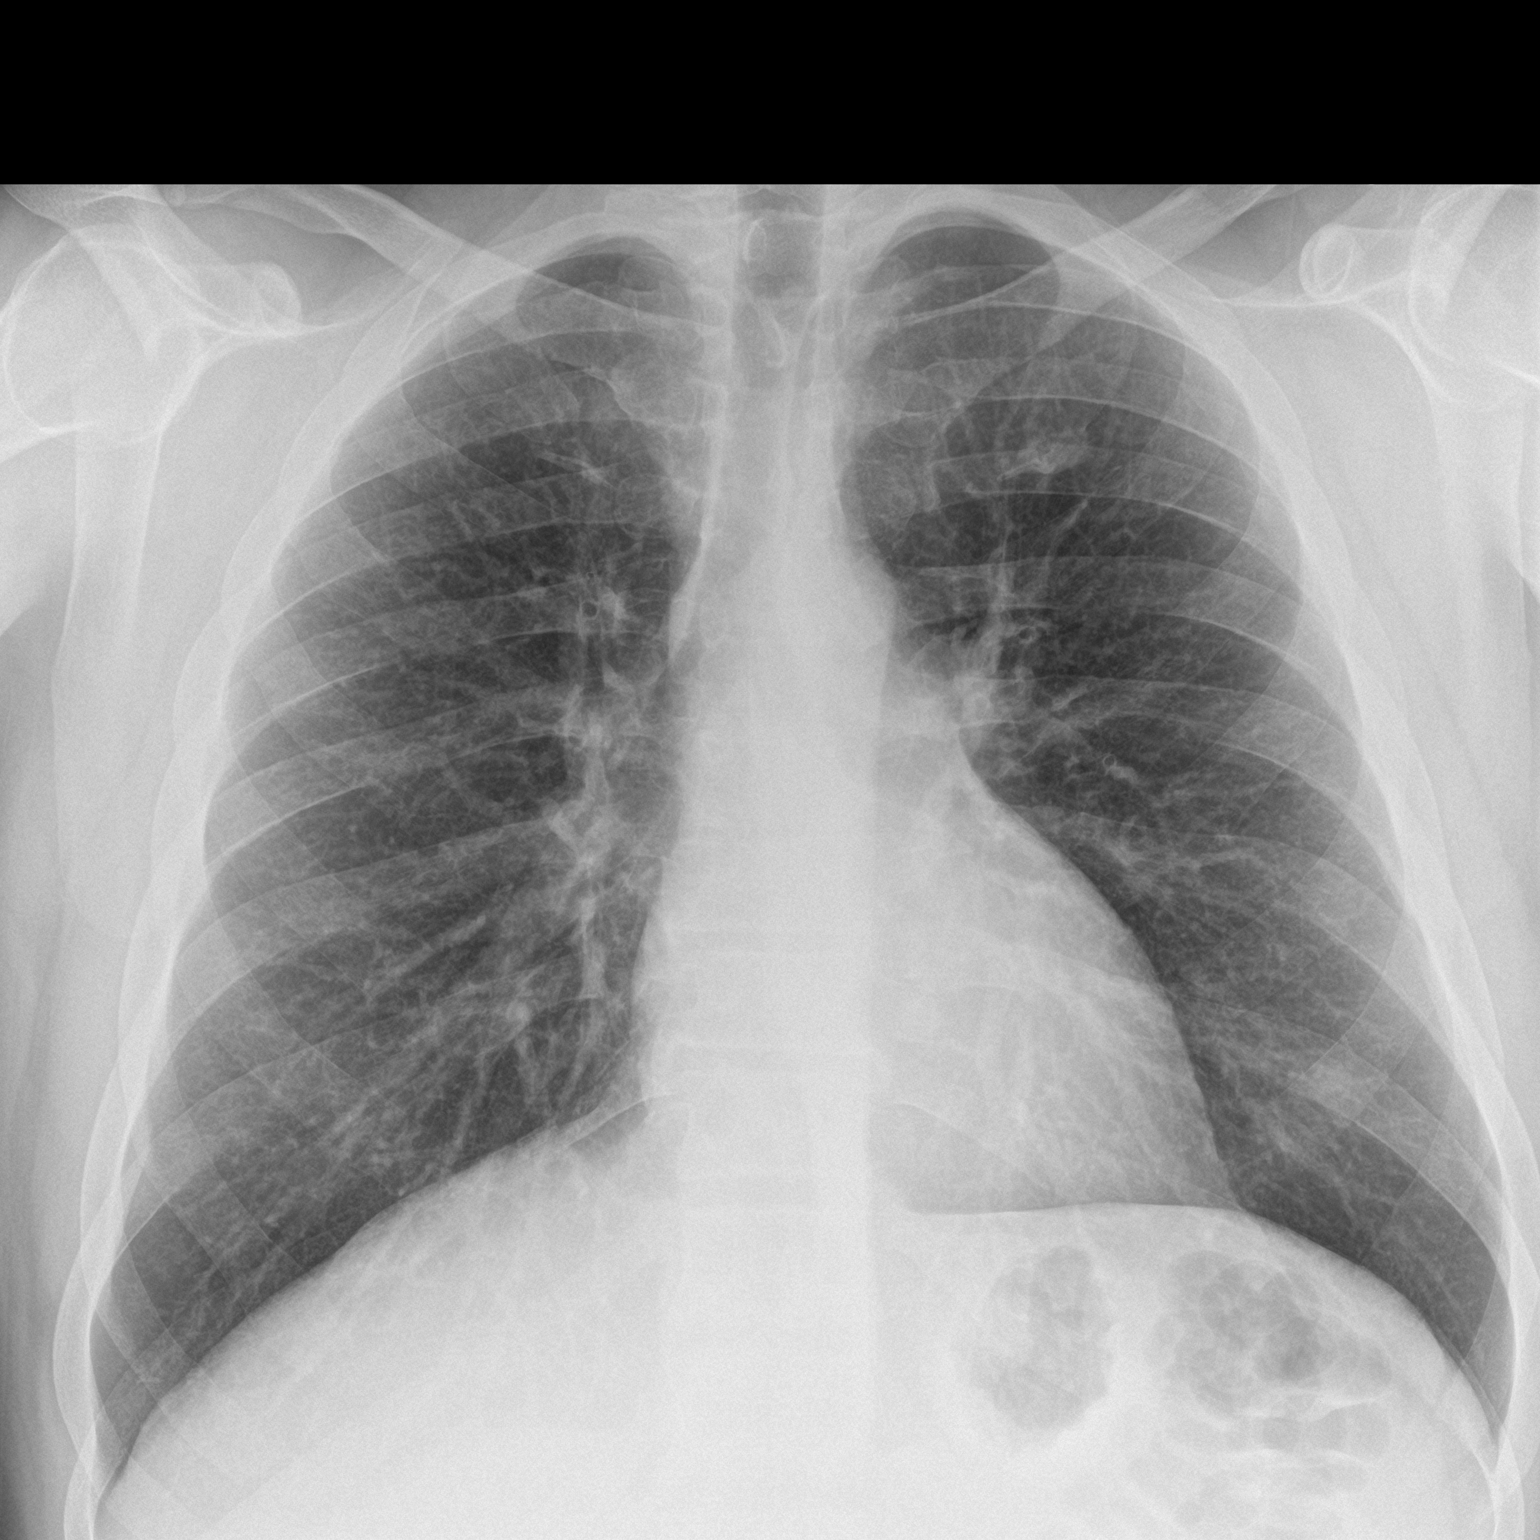

[chest lat]
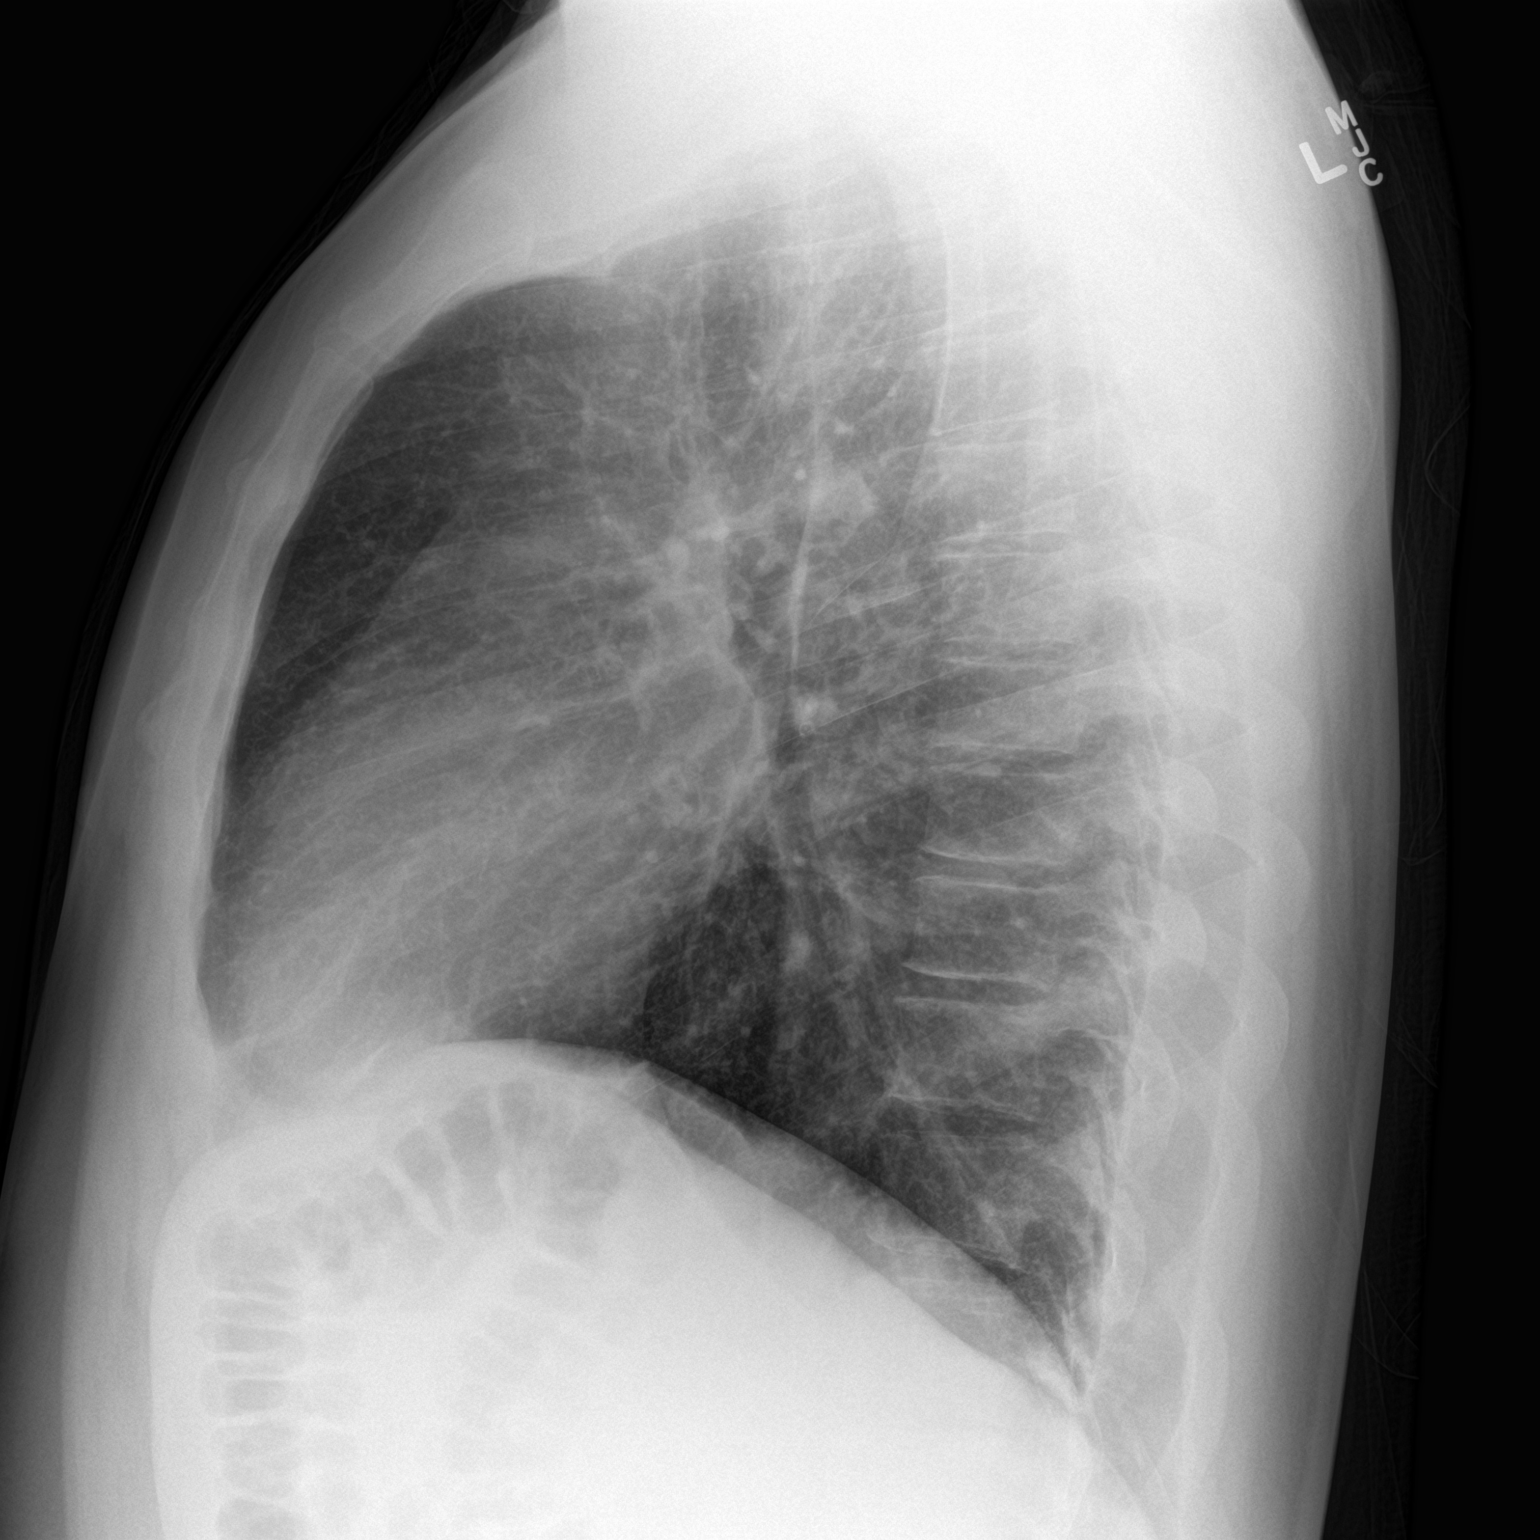

[2 of 2 positions shown; findings below may reference images not displayed]

FINDINGS: The heart size and mediastinal contours are within normal limits.
Both lungs are clear. The visualized skeletal structures are
unremarkable.
IMPRESSION: No active cardiopulmonary disease.

## 2019-12-23 ENCOUNTER — Ambulatory Visit: Payer: Self-pay | Admitting: Family Medicine

## 2019-12-23 ENCOUNTER — Other Ambulatory Visit: Payer: Self-pay

## 2022-09-10 ENCOUNTER — Ambulatory Visit: Payer: Self-pay | Admitting: Internal Medicine

## 2022-09-12 ENCOUNTER — Encounter: Payer: Self-pay | Admitting: Internal Medicine

## 2022-09-12 ENCOUNTER — Ambulatory Visit (INDEPENDENT_AMBULATORY_CARE_PROVIDER_SITE_OTHER): Payer: 59 | Admitting: Internal Medicine

## 2022-09-12 VITALS — BP 137/82 | HR 75 | Resp 16 | Ht 78.0 in | Wt 284.0 lb

## 2022-09-12 DIAGNOSIS — Z0001 Encounter for general adult medical examination with abnormal findings: Secondary | ICD-10-CM | POA: Diagnosis not present

## 2022-09-12 DIAGNOSIS — Z1211 Encounter for screening for malignant neoplasm of colon: Secondary | ICD-10-CM | POA: Diagnosis not present

## 2022-09-12 DIAGNOSIS — L309 Dermatitis, unspecified: Secondary | ICD-10-CM | POA: Insufficient documentation

## 2022-09-12 DIAGNOSIS — F5101 Primary insomnia: Secondary | ICD-10-CM | POA: Insufficient documentation

## 2022-09-12 DIAGNOSIS — Z6830 Body mass index (BMI) 30.0-30.9, adult: Secondary | ICD-10-CM

## 2022-09-12 DIAGNOSIS — E6609 Other obesity due to excess calories: Secondary | ICD-10-CM | POA: Insufficient documentation

## 2022-09-12 MED ORDER — RAMELTEON 8 MG PO TABS: 8.0000 mg | ORAL_TABLET | Freq: Every day | ORAL | 0 refills | Status: DC

## 2022-09-12 MED ORDER — TRIAMCINOLONE ACETONIDE 0.5 % EX CREA
1.0000 | TOPICAL_CREAM | Freq: Two times a day (BID) | CUTANEOUS | 0 refills | Status: DC
Start: 2022-09-12 — End: 2022-10-09

## 2022-09-12 NOTE — Assessment & Plan Note (Signed)
Expect patient is having a exposure to irritant, but cannot explain why he is on left and not right after our discussion.  Appears to be an eczema Start high potency steroid cream twice daily for 2 to 4 weeks, can stop when rash improves Wear gloves to minimize exposure to irritants, recommend liberal use of moisturizer and Vaseline during sleep. Follow up if not improving. If improved after one month we can prescribe medium potency steroid cream for maintenance.

## 2022-09-12 NOTE — Assessment & Plan Note (Addendum)
One-time screening for hepatitis C and HIV Order baseline CBC

## 2022-09-12 NOTE — Assessment & Plan Note (Signed)
BMI 32 Recommend increased activity level Lipid panel, hemoglobin A1c, TSH,Vit D

## 2022-09-12 NOTE — Progress Notes (Signed)
HPI:EddieEddie Scott is a 47 y.o. male who has not routinely followed with a healthcare provider presents to establish care.  He has concern for insomnia.  This is started in the last 3 months.  He has worked third shift for the past 5 years, but recently has had more problems with falling asleep and staying  asleep.   He has started a relationship with someone who is not on a 3rd shift schedule.  He has taken melatonin up to 30 mg and still having problems falling asleep and staying asleep.  He is left hand is also dry in his right hand is moisturized. He has had this problem for over a year.  The hand improves with lotion but quickly becomes very dried out again.  We discussed his work , but could not identify a difference between his left and right hand use.  He is right-handed. No rash on his feet.    History reviewed. No pertinent past medical history.  Past Surgical History:  Procedure Laterality Date   APPENDECTOMY     ESOPHAGOGASTRODUODENOSCOPY  09/01/2016   Dr. Jena Gauss: medium-sized hiatal hernia. Food bolus in midesophagus s/p removal by piecemeal fashion. Question ringed appearance of esophagus suspicious for EOE, no dilation. Incomplete exam due to poor prep. 4 week repeat EGD recommended.    ESOPHAGOGASTRODUODENOSCOPY N/A 09/01/2016   Procedure: ESOPHAGOGASTRODUODENOSCOPY (EGD);  Surgeon: Corbin Ade, MD;  Location: [facility];  Service: Endoscopy;  Laterality: N/A;    Family History  Problem Relation Age of Onset   Alcohol abuse Father    Heart disease Brother     Social History   Tobacco Use   Smoking status: Every Day    Packs/day: .5    Types: Cigarettes   Smokeless tobacco: Never  Substance Use Topics   Alcohol use: Yes    Comment: occasional   Drug use: Yes    Types: Marijuana    Comment: occasional     Physical Exam: Vitals:   09/12/22 0839  BP: 137/82  Pulse: 75  Resp: 16  SpO2: 97%  Weight: 284 lb (128.8 kg)  Height: 6\' 6"  (1.981 m)      Physical Exam Constitutional:      Appearance: He is well-developed and well-groomed. He is obese.  Eyes:     General: No scleral icterus.    Conjunctiva/sclera: Conjunctivae normal.  Cardiovascular:     Rate and Rhythm: Normal rate and regular rhythm.     Heart sounds: No murmur heard.    No friction rub. No gallop.  Pulmonary:     Effort: Pulmonary effort is normal.     Breath sounds: No wheezing, rhonchi or rales.  Musculoskeletal:     Right lower leg: No edema.     Left lower leg: No edema.  Skin:    General: Skin is dry.     Comments: Dry flaky skin on palmer surface of left hand      Assessment & Plan:   Primary insomnia Counseled on sleep hygiene Start ramelteon Follow-up if sleep not improving  Hand eczema Expect patient is having a exposure to irritant, but cannot explain why he is on left and not right after our discussion.  Appears to be an eczema Start high potency steroid cream twice daily for 2 to 4 weeks, can stop when rash improves Wear gloves to minimize exposure to irritants, recommend liberal use of moisturizer and Vaseline during sleep. Follow up if not improving. If improved  after one month we can prescribe medium potency steroid cream for maintenance.   Colon cancer screening Discussed colon cancer screening options Cologuard ordered  Encounter for general adult medical examination with abnormal findings One-time screening for hepatitis C and HIV Order baseline CBC  Class 1 obesity due to excess calories without serious comorbidity with body mass index (BMI) of 30.0 to 30.9 in adult BMI 32 Recommend increased activity level Lipid panel, hemoglobin A1c, TSH,Vit D    Milus Banister, MD

## 2022-09-12 NOTE — Patient Instructions (Addendum)
Thank you, [name] for allowing [place] to provide your care today.   I have ordered the following labs for you:   Lab Orders         VITAMIN D 25 Hydroxy (Vit-D Deficiency, Fractures)         Lipid panel         CMP14+EGFR         CBC with Differential/Platelet         TSH         Hemoglobin A1c         HIV Antibody (routine testing w rflx)         Hepatitis C antibody         Cologuard        Reminders:  Personal recommendations for your health:  Work on cutting back smoking  Regular physical activity is one of the most important things you can do for your health. If you're ready to get the immediate benefits of better sleep, reduced anxiety, and lower blood pressure, here are ways to get started. Look for ways to reduce time sitting and increase time moving. For example, make it a tradition to walk before or after dinner. I recommend at least 150 minutes of moderate-intensity physical activity a week for adults. You might split that into 30 minutes, 5 days a week.  2. Start Ramelteon for sleep. Sleep hygiene as below  Keep a sleep diary to help you and your health care provider figure out what could be causing your insomnia. Write down: When you sleep. When you wake up during the night. How well you sleep and how rested you feel the next day. Any side effects of medicines you are taking. What you eat and drink. Make your bedroom a dark, comfortable place where it is easy to fall asleep. Put up shades or blackout curtains to block light from outside. Use a white noise machine to block noise. Keep the temperature cool. Limit screen use before bedtime. This includes: Not watching TV. Not using your smartphone, tablet, or computer. Stick to a routine that includes going to bed and waking up at the same times every day and night. This can help you fall asleep faster. Consider making a quiet activity, such as reading, part of your nighttime routine. Try to avoid taking  naps during the day so that you sleep better at night. Get out of bed if you are still awake after 15 minutes of trying to sleep. Keep the lights down, but try reading or doing a quiet activity. When you feel sleepy, go back to bed  3. Moisturize your hand with Vaseline at night for 2 weeks.  You can wear a cotton glove over your hand.  Send a MyChart message and this is not improving.  Thurmon Fair, M.D.

## 2022-09-12 NOTE — Assessment & Plan Note (Signed)
Discussed colon cancer screening options Cologuard ordered

## 2022-09-12 NOTE — Assessment & Plan Note (Signed)
Counseled on sleep hygiene Start ramelteon Follow-up if sleep not improving

## 2022-09-13 ENCOUNTER — Other Ambulatory Visit: Payer: Self-pay | Admitting: Internal Medicine

## 2022-09-13 ENCOUNTER — Encounter: Payer: Self-pay | Admitting: Internal Medicine

## 2022-09-13 MED ORDER — ZOLPIDEM TARTRATE 5 MG PO TABS: 5.0000 mg | ORAL_TABLET | Freq: Every evening | ORAL | 1 refills | Status: DC | PRN

## 2022-09-13 NOTE — Telephone Encounter (Signed)
The Rozerem isn't covered. Needs a cheaper alternative

## 2022-09-13 NOTE — Progress Notes (Signed)
Ramelteon not covered by insurance. Ambien sent to pharmacy for PRN use. Follow up in one month.

## 2022-09-18 ENCOUNTER — Other Ambulatory Visit: Payer: Self-pay | Admitting: Internal Medicine

## 2022-09-18 LAB — LIPID PANEL
Chol/HDL Ratio: 3.3 ratio (ref 0.0–5.0)
Cholesterol, Total: 131 mg/dL (ref 100–199)
HDL: 40 mg/dL (ref >39)
LDL Chol Calc (NIH): 75 mg/dL (ref 0–99)
Triglycerides: 78 mg/dL (ref 0–149)
VLDL Cholesterol Cal: 16 mg/dL (ref 5–40)

## 2022-09-18 LAB — CMP14+EGFR
ALT: 35 IU/L (ref 0–44)
AST: 25 IU/L (ref 0–40)
Albumin/Globulin Ratio: 1.9 (ref 1.2–2.2)
Albumin: 4.8 g/dL (ref 4.1–5.1)
Alkaline Phosphatase: 60 IU/L (ref 44–121)
BUN/Creatinine Ratio: 18 (ref 9–20)
BUN: 16 mg/dL (ref 6–24)
Bilirubin Total: 0.5 mg/dL (ref 0.0–1.2)
CO2: 22 mmol/L (ref 20–29)
Calcium: 9.6 mg/dL (ref 8.7–10.2)
Chloride: 105 mmol/L (ref 96–106)
Creatinine, Ser: 0.88 mg/dL (ref 0.76–1.27)
Globulin, Total: 2.5 g/dL (ref 1.5–4.5)
Glucose: 79 mg/dL (ref 70–99)
Potassium: 4.6 mmol/L (ref 3.5–5.2)
Sodium: 141 mmol/L (ref 134–144)
Total Protein: 7.3 g/dL (ref 6.0–8.5)
eGFR: 108 mL/min/{1.73_m2} (ref >59)

## 2022-09-18 LAB — CBC WITH DIFFERENTIAL/PLATELET
Basophils Absolute: 0.1 10*3/uL (ref 0.0–0.2)
Basos: 1 %
EOS (ABSOLUTE): 0.5 10*3/uL — ABNORMAL HIGH (ref 0.0–0.4)
Eos: 4 %
Hematocrit: 47.5 % (ref 37.5–51.0)
Hemoglobin: 16 g/dL (ref 13.0–17.7)
Immature Grans (Abs): 0 10*3/uL (ref 0.0–0.1)
Immature Granulocytes: 0 %
Lymphocytes Absolute: 3 10*3/uL (ref 0.7–3.1)
Lymphs: 25 %
MCH: 30.1 pg (ref 26.6–33.0)
MCHC: 33.7 g/dL (ref 31.5–35.7)
MCV: 89 fL (ref 79–97)
Monocytes Absolute: 0.8 10*3/uL (ref 0.1–0.9)
Monocytes: 7 %
Neutrophils Absolute: 7.7 10*3/uL — ABNORMAL HIGH (ref 1.4–7.0)
Neutrophils: 63 %
Platelets: 263 10*3/uL (ref 150–450)
RBC: 5.32 x10E6/uL (ref 4.14–5.80)
RDW: 12.7 % (ref 11.6–15.4)
WBC: 12.1 10*3/uL — ABNORMAL HIGH (ref 3.4–10.8)

## 2022-09-18 LAB — HEPATITIS C ANTIBODY: Hep C Virus Ab: NONREACTIVE

## 2022-09-18 LAB — VITAMIN D 25 HYDROXY (VIT D DEFICIENCY, FRACTURES): Vit D, 25-Hydroxy: 14.5 ng/mL — ABNORMAL LOW (ref 30.0–100.0)

## 2022-09-18 LAB — HEMOGLOBIN A1C
Est. average glucose Bld gHb Est-mCnc: 100 mg/dL
Hgb A1c MFr Bld: 5.1 % (ref 4.8–5.6)

## 2022-09-18 LAB — HIV ANTIBODY (ROUTINE TESTING W REFLEX): HIV Screen 4th Generation wRfx: NONREACTIVE

## 2022-09-18 LAB — TSH: TSH: 2.25 u[IU]/mL (ref 0.450–4.500)

## 2022-09-18 MED ORDER — VITAMIN D3 25 MCG (1000 UT) PO CAPS: 1000.0000 [IU] | ORAL_CAPSULE | Freq: Every day | ORAL | 1 refills | Status: DC

## 2022-09-22 ENCOUNTER — Encounter: Payer: Self-pay | Admitting: Internal Medicine

## 2022-09-24 ENCOUNTER — Other Ambulatory Visit: Payer: Self-pay

## 2022-09-24 MED ORDER — VITAMIN D3 25 MCG (1000 UT) PO CAPS: 1000.0000 [IU] | ORAL_CAPSULE | Freq: Every day | ORAL | 1 refills | Status: AC

## 2022-10-09 ENCOUNTER — Encounter: Payer: Self-pay | Admitting: Internal Medicine

## 2022-10-09 ENCOUNTER — Other Ambulatory Visit: Payer: Self-pay | Admitting: Internal Medicine

## 2022-10-09 DIAGNOSIS — G4709 Other insomnia: Secondary | ICD-10-CM

## 2022-10-09 DIAGNOSIS — L309 Dermatitis, unspecified: Secondary | ICD-10-CM

## 2022-10-09 MED ORDER — ZOLPIDEM TARTRATE 10 MG PO TABS
10.0000 mg | ORAL_TABLET | Freq: Every evening | ORAL | 0 refills | Status: AC | PRN
Start: 2022-10-09 — End: ?

## 2022-10-09 MED ORDER — TRIAMCINOLONE ACETONIDE 0.1 % EX CREA
1.0000 | TOPICAL_CREAM | Freq: Two times a day (BID) | CUTANEOUS | 0 refills | Status: AC | PRN
Start: 2022-10-09 — End: ?

## 2022-11-23 ENCOUNTER — Encounter: Payer: Self-pay | Admitting: Internal Medicine

## 2022-11-26 NOTE — Telephone Encounter (Signed)
I recommend a follow up with Dr.Dixon to discuss further treatment.

## 2022-12-13 ENCOUNTER — Ambulatory Visit: Payer: 59 | Admitting: Internal Medicine

## 2023-02-25 ENCOUNTER — Ambulatory Visit: Payer: 59 | Admitting: Internal Medicine

## 2023-02-28 ENCOUNTER — Encounter: Payer: Self-pay | Admitting: Internal Medicine
